# Patient Record
Sex: Female | Born: 1980 | Hispanic: Yes | Smoking: Never smoker
Health system: Southern US, Community
[De-identification: ages and names within clinical notes are randomized; demographics above are authoritative.]

---

## 2004-02-01 ENCOUNTER — Ambulatory Visit: Payer: Self-pay | Admitting: Family Medicine

## 2004-04-16 ENCOUNTER — Inpatient Hospital Stay: Payer: Self-pay

## 2004-05-12 ENCOUNTER — Emergency Department: Payer: Self-pay | Admitting: General Practice

## 2004-05-14 ENCOUNTER — Ambulatory Visit: Payer: Self-pay | Admitting: Unknown Physician Specialty

## 2005-07-16 ENCOUNTER — Emergency Department: Payer: Self-pay | Admitting: Emergency Medicine

## 2006-10-01 ENCOUNTER — Ambulatory Visit: Payer: Self-pay | Admitting: Family Medicine

## 2006-11-26 ENCOUNTER — Encounter: Payer: Self-pay | Admitting: Maternal and Fetal Medicine

## 2007-02-12 ENCOUNTER — Inpatient Hospital Stay: Payer: Self-pay | Admitting: Obstetrics and Gynecology

## 2012-12-27 ENCOUNTER — Ambulatory Visit: Payer: Self-pay | Admitting: Primary Care

## 2013-08-07 ENCOUNTER — Ambulatory Visit: Payer: Self-pay | Admitting: Primary Care

## 2017-04-03 NOTE — L&D Delivery Note (Signed)
Delivery Note CTSP and immediately arrived with cx exam of 7-8 cms. Checked pt and small ant lip was palp. Prepared for birth. Legs in stirrups, Interpreter with pt. AROM performed at 2040 for clear fluid mod amt. Pt pushed with coaching and At 9:00 PM a viable female was delivered via Vaginal, Spontaneous (Presentation:ROA ;Vtx, Ant and post shoulder del and then body followed and to mom's abd. CCx2 and cut per dad after delayed cord clamping.  APGAR: 8, 9; weight  .   Placenta status:SDOP intact at    And FF and lochia mod. There was a huge gush of blood right at delivery of placenta of 300 ml's.There was one area of concern noted on the placenta ([facility]). Suspect area of poss abruption and will send to pathology  ,1st degree perineal repair with 1%xylocaine and 3-0 CH on CT for repair. .  Cord:  with the following complications: none. Needles x 2 and correct and sponge ct x 10 correct. Nsy RN with baby.  Anesthesia:  1% local for repair Episiotomy:  None  Lacerations: 1st degree perineal Suture Repair:3-0 CH on CT Est. Blood Loss (mL):  300 ml's  Mom to Post partum recovery. Orders placed. Baby to mom's abd for skin to skin  ___________________________ Myrtie Cruisearon W. Jones,RN, MSN, CNM, FNP Certified Nurse Midwife [facility] 09/26/2017, 9:41 PM

## 2017-04-06 LAB — OB RESULTS CONSOLE GC/CHLAMYDIA
CHLAMYDIA, DNA PROBE: NEGATIVE
GC PROBE AMP, GENITAL: NEGATIVE

## 2017-04-06 LAB — OB RESULTS CONSOLE ABO/RH: RH Type: POSITIVE

## 2017-04-06 LAB — OB RESULTS CONSOLE HIV ANTIBODY (ROUTINE TESTING): HIV: NONREACTIVE

## 2017-04-06 LAB — OB RESULTS CONSOLE ANTIBODY SCREEN: Antibody Screen: NEGATIVE

## 2017-04-06 LAB — OB RESULTS CONSOLE RPR: RPR: NONREACTIVE

## 2017-04-06 LAB — OB RESULTS CONSOLE RUBELLA ANTIBODY, IGM: RUBELLA: IMMUNE

## 2017-04-06 LAB — OB RESULTS CONSOLE HEPATITIS B SURFACE ANTIGEN: Hepatitis B Surface Ag: NEGATIVE

## 2017-04-06 LAB — OB RESULTS CONSOLE VARICELLA ZOSTER ANTIBODY, IGG: Varicella: IMMUNE

## 2017-05-08 ENCOUNTER — Other Ambulatory Visit: Payer: Self-pay | Admitting: Primary Care

## 2017-05-08 DIAGNOSIS — Z3482 Encounter for supervision of other normal pregnancy, second trimester: Secondary | ICD-10-CM

## 2017-05-22 ENCOUNTER — Ambulatory Visit
Admission: RE | Admit: 2017-05-22 | Discharge: 2017-05-22 | Disposition: A | Discharge status: Home / Self Care | Payer: Self-pay | Source: Ambulatory Visit | Attending: Primary Care | Admitting: Primary Care

## 2017-05-22 DIAGNOSIS — Z3482 Encounter for supervision of other normal pregnancy, second trimester: Secondary | ICD-10-CM | POA: Insufficient documentation

## 2017-08-22 LAB — OB RESULTS CONSOLE GBS: STREP GROUP B AG: NEGATIVE

## 2017-09-26 ENCOUNTER — Other Ambulatory Visit: Payer: Self-pay

## 2017-09-26 ENCOUNTER — Inpatient Hospital Stay
Admission: EM | Admit: 2017-09-26 | Discharge: 2017-09-28 | DRG: 807 | Disposition: A | Discharge status: Home / Self Care | Payer: Medicaid Other | Attending: Obstetrics and Gynecology | Admitting: Obstetrics and Gynecology

## 2017-09-26 DIAGNOSIS — D649 Anemia, unspecified: Secondary | ICD-10-CM | POA: Diagnosis present

## 2017-09-26 DIAGNOSIS — O9902 Anemia complicating childbirth: Principal | ICD-10-CM | POA: Diagnosis present

## 2017-09-26 DIAGNOSIS — Z3A38 38 weeks gestation of pregnancy: Secondary | ICD-10-CM

## 2017-09-26 DIAGNOSIS — Z3483 Encounter for supervision of other normal pregnancy, third trimester: Secondary | ICD-10-CM | POA: Diagnosis present

## 2017-09-26 LAB — TYPE AND SCREEN
ABO/RH(D): A POS
Antibody Screen: NEGATIVE

## 2017-09-26 LAB — CBC
HEMATOCRIT: 34.6 % — AB (ref 35.0–47.0)
HEMOGLOBIN: 11.5 g/dL — AB (ref 12.0–16.0)
MCH: 27.5 pg (ref 26.0–34.0)
MCHC: 33.1 g/dL (ref 32.0–36.0)
MCV: 83.1 fL (ref 80.0–100.0)
Platelets: 222 10*3/uL (ref 150–440)
RBC: 4.16 MIL/uL (ref 3.80–5.20)
RDW: 19.3 % — ABNORMAL HIGH (ref 11.5–14.5)
WBC: 14.5 10*3/uL — ABNORMAL HIGH (ref 3.6–11.0)

## 2017-09-26 MED ORDER — LIDOCAINE HCL (PF) 1 % IJ SOLN: 30.0000 mL | INTRAMUSCULAR | Status: DC | PRN

## 2017-09-26 MED ORDER — LACTATED RINGERS IV SOLN: INTRAVENOUS | Status: DC

## 2017-09-26 MED ORDER — MISOPROSTOL 200 MCG PO TABS
ORAL_TABLET | ORAL | Status: AC
  Filled 2017-09-26: qty 4

## 2017-09-26 MED ORDER — OXYTOCIN 40 UNITS IN LACTATED RINGERS INFUSION - SIMPLE MED
2.5000 [IU]/h | INTRAVENOUS | Status: DC
  Administered 2017-09-26: 999 mL/h via INTRAVENOUS

## 2017-09-26 MED ORDER — SOD CITRATE-CITRIC ACID 500-334 MG/5ML PO SOLN: 30.0000 mL | ORAL | Status: DC | PRN

## 2017-09-26 MED ORDER — ACETAMINOPHEN 325 MG PO TABS: 650.0000 mg | ORAL_TABLET | ORAL | Status: DC | PRN

## 2017-09-26 MED ORDER — LACTATED RINGERS IV SOLN
INTRAVENOUS | Status: DC
  Administered 2017-09-26: 21:00:00 via INTRAVENOUS

## 2017-09-26 MED ORDER — OXYTOCIN 10 UNIT/ML IJ SOLN
INTRAMUSCULAR | Status: AC
  Filled 2017-09-26: qty 2

## 2017-09-26 MED ORDER — OXYTOCIN BOLUS FROM INFUSION: 500.0000 mL | Freq: Once | INTRAVENOUS | Status: DC

## 2017-09-26 MED ORDER — BUTORPHANOL TARTRATE 1 MG/ML IJ SOLN: 1.0000 mg | INTRAMUSCULAR | Status: DC | PRN

## 2017-09-26 MED ORDER — LACTATED RINGERS IV SOLN: 500.0000 mL | INTRAVENOUS | Status: DC | PRN

## 2017-09-26 MED ORDER — ONDANSETRON HCL 4 MG/2ML IJ SOLN: 4.0000 mg | Freq: Four times a day (QID) | INTRAMUSCULAR | Status: DC | PRN

## 2017-09-26 MED ORDER — OXYTOCIN 40 UNITS IN LACTATED RINGERS INFUSION - SIMPLE MED
INTRAVENOUS | Status: AC
  Administered 2017-09-26: 999 mL/h via INTRAVENOUS
  Filled 2017-09-26: qty 1000

## 2017-09-26 MED ORDER — IBUPROFEN 600 MG PO TABS
600.0000 mg | ORAL_TABLET | Freq: Four times a day (QID) | ORAL | Status: DC | PRN
  Administered 2017-09-26 – 2017-09-27 (×3): 600 mg via ORAL
  Filled 2017-09-26 (×4): qty 1

## 2017-09-26 MED ORDER — LIDOCAINE HCL (PF) 1 % IJ SOLN
INTRAMUSCULAR | Status: AC
  Filled 2017-09-26: qty 30

## 2017-09-26 MED ORDER — AMMONIA AROMATIC IN INHA
RESPIRATORY_TRACT | Status: AC
  Filled 2017-09-26: qty 10

## 2017-09-26 NOTE — Discharge Summary (Signed)
Obstetric Discharge Summary   Patient ID: Patient Name: Olivia Daniels DOB: 21-Aug-1980 MRN: [id number]  Date of Admission: 09/26/2017 Date of Delivery: 09/26/17 Delivered by: Harden MoJones, CNM Date of Discharge: 09/28/2017  [facility]:  [facility]  LMP:No LMP recorded. EDC Estimated Date of Delivery: 10/10/17 Gestational Age at Delivery: 6155w0d   Antepartum complications: anemia Admitting Diagnosis: Term pregnancy in active labor   Secondary Diagnoses: Patient Active Problem List   Diagnosis Date Noted  . Indication for care in labor and delivery, antepartum 09/26/2017    Augmentation: AROM  Complications:Suspect Abruption Intrapartum complications/course:  Delivery Type: NSVD of viable female infant "Yael" Anesthesia: lidocaine for repair Placenta: sponatneous Laceration: 1st degree perineal Episiotomy: none  Newborn Data: Live born female  Birth Weight:   APGAR: 8, 9  Newborn Delivery   Birth date/time:  09/26/2017 21:00:00 Delivery type:  Vaginal, Spontaneous    Postpartum Course  Patient had an uncomplicated postpartum course. By time of discharge on PPD#2, her pain was controlled on oral pain medications; she had appropriate lochia and was ambulating, voiding without difficulty and tolerating regular diet.  She was deemed stable for discharge to home.      Physical exam:  BP 116/78 (BP Location: Right Arm)   Pulse 94   Temp 98.4 F (36.9 C) (Oral)   Resp 18   Ht 5\' 3"  (1.6 m)   Wt 160 lb (72.6 kg)   SpO2 100%   Breastfeeding? Unknown   BMI 28.34 kg/m  General: alert and no distress Pulm: normal respiratory effort Lochia: appropriate Abdomen: soft, NT Uterine Fundus: firm, below umbilicus Extremities: No evidence of DVT seen on physical exam. No lower extremity edema. Perineum:healing and sutures intact.   Disposition: stable, discharge to home Baby Feeding: breastmilk Baby Disposition: home with mom  Contraception: Depo-Provera   Prenatal Labs:   Blood type/Rh A+  Antibody screen neg  Rubella Immune  Varicella Immune  RPR NR  HBsAg Neg  HIV NR  GC neg  Chlamydia neg  1 hour GTT 137  3 hour GTT 79, 145, 144, 138  GBS negative    Rh Immune globulin given: n/a Rubella vaccine given: n/a Tdap vaccine given in AP or PP setting: AP 08/07/2017 Flu vaccine given in AP or PP setting: declined AP   Plan:  Olivia Daniels was discharged to home in good condition. [facility] with delivery provider in 6 weeks.  Discharge Instructions: Per After Visit Summary. Activity: Advance as tolerated. Pelvic rest for 6 weeks.   Diet: Regular Discharge Medications:PNV, Fe, Ibuprofen  Outpatient follow up:  Follow-up Information    Sharee PimpleJones, Caron W, CNM Follow up in 6 week(s).   Specialty:  Obstetrics and Gynecology Contact information: [address] [facility] [place] [address] [phone]         6 weeks with CJones, CN[facility] _________________________________   Signed: Dala DockMcVey, Karmon Andis A, CNM 09/28/2017  9:49 AM

## 2017-09-26 NOTE — H&P (Addendum)
OB History & Physical   History of Present Illness:  Chief Complaint[facility] S/S starting at 1700 today/.  HPI:  Olivia Daniels is a 37 y.o. G3P2002 female at 1523w0d dated by LMP of 01/03/17 with EDD of 10/10/17. US at 21 2/7 weeks with EDD of 09/30/17..  She presents to L&D for active labor with advanced cx dilation of 7-8 cms. Pt began laboring around 5pm.   +FM, no CTX, no LOF, no VB  Pregnancy Issues: 1. GERD 2. Pelvic pain (chronic) 3. Allergic Dermatitis 4. AMA  Maternal Medical History:  GERD, Pelvic pain, Allergic dermatitis  PSH: Appendectomy, Cholecystectomy OB/GYN: 1st preg: 40 weeks, NSVD of viable female, 7#0, no complications 2nd preg: 41 weeks, NSVD of viable female, 6#, no complications No Known Allergies  Prior to Admission medications   Medication Sig Start Date End Date Taking? Authorizing Provider  ferrous sulfate (FER-IN-SOL) 75 (15 Fe) MG/ML SOLN Take by mouth.   Yes [provider]  Prenatal Vit-Fe Fumarate-FA (PRENATAL MULTIVITAMIN) TABS tablet Take 1 tablet by mouth daily at 12 noon.   Yes [provider]    [facility]   Social History: She  reports that she has never smoked. She has never used smokeless tobacco. She reports that she drank alcohol. She reports that she has current or past drug history.  Family History: Declines any pertinent FMH  Review of Systems: A full review of systems was performed and negative except as noted in the HPI.     Physical Exam:  Vital Signs: BP 136/86   Pulse 97   Temp 98.4 F (36.9 C) (Oral)   Resp (!) 22   Ht 5\' 3"  (1.6 m)   Wt 160 lb (72.6 kg)   Breastfeeding? Unknown   BMI 28.34 kg/m  General: no acute distress.  HEENT: normocephalic, atraumatic Heart: regular rate & rhythm.  No murmurs/rubs/gallops Lungs: clear to auscultation bilaterally, normal respiratory effort Abdomen: soft, gravid, non-tender;  EFW:8#5oz Pelvic:   External:  Normal external female genitalia  Cervix: Dilation: 10 / Effacement (%): 90 / Station: -1    Extremities: non-tender, symmetric, 1+ edema bilaterally.  DTRs: not checked due to rapid delivery  Neurologic: Alert & oriented x 3.    No results found for this or any previous visit (from the past 24 hour(s)).  Pertinent Results:  Prenatal Labs: Blood type/Rh A pos  Antibody screen neg  Rubella Immune  Varicella Immune  RPR NR  HBsAg Neg  HIV NR  GC neg  Chlamydia neg  Genetic screening negative  1 hour GTT Not found  3 hour GTT   GBS Neg    FHT:150 TOCO:q 2 mins SVE:  Dilation: 10 / Effacement (%): 90 / Station: -1    Cephalic by leopolds  Labs: A pos, GBS neg, HIV:NR, Varicella immune,Rubella immune, GC/CH neg   Assessment:  Olivia Daniels is a 37 y.o. 711P1001 female at 7123w0d with active labor process. AROM performed immediately prior to delivery and was clear.   Plan:  1. Admit to Labor & Delivery 2. CBC, T&S, Clrs, IVF 3. GBS neg  4. Consents obtained. 5. Continuous efm/toco 6. Ant SVD. 7. Pt did not receive a Tdap during AP:Will given PP  ----- Myrtie Cruisearon W. Jones,RN, MSN, CNM, FNP Certified Nurse Midwife [facility]

## 2017-09-27 LAB — CBC
HCT: 30.3 % — ABNORMAL LOW (ref 35.0–47.0)
Hemoglobin: 10.1 g/dL — ABNORMAL LOW (ref 12.0–16.0)
MCH: 27.7 pg (ref 26.0–34.0)
MCHC: 33.2 g/dL (ref 32.0–36.0)
MCV: 83.4 fL (ref 80.0–100.0)
PLATELETS: 185 10*3/uL (ref 150–440)
RBC: 3.64 MIL/uL — AB (ref 3.80–5.20)
RDW: 20.2 % — ABNORMAL HIGH (ref 11.5–14.5)
WBC: 10.6 10*3/uL (ref 3.6–11.0)

## 2017-09-27 MED ORDER — FERROUS SULFATE 325 (65 FE) MG PO TABS
325.0000 mg | ORAL_TABLET | Freq: Every day | ORAL | Status: DC
  Administered 2017-09-27 – 2017-09-28 (×2): 325 mg via ORAL
  Filled 2017-09-27 (×2): qty 1

## 2017-09-27 NOTE — Progress Notes (Signed)
Post Partum Day 1  Subjective: Doing well, no concerns. Ambulating without difficulty, pain managed with PO meds, tolerating regular diet, and voiding without difficulty.   No fever/chills, chest pain, shortness of breath, nausea/vomiting, or leg pain. No nipple or breast pain.   Objective: BP 111/70 (BP Location: Left Arm)   Pulse 88   Temp 98.5 F (36.9 C) (Oral)   Resp 20   Ht 5\' 3"  (1.6 m)   Wt 72.6 kg (160 lb)   SpO2 98%   Breastfeeding? Unknown   BMI 28.34 kg/m    Physical Exam:  General: alert, cooperative, appears stated age and no distress Breasts: soft/nontender CV: RRR Pulm: nl effort, CTABL Abdomen: soft, non-tender, active bowel sounds Uterine Fundus: firm Lochia: appropriate DVT Evaluation: No evidence of DVT seen on physical exam. No cords or calf tenderness. No significant calf/ankle edema.  Recent Labs    09/26/17 2236  HGB 11.5*  HCT 34.6*  WBC 14.5*  PLT 222    Assessment/Plan: 37 y.o. G3P3003 postpartum day # 1  -Continue routine PP care -Lactation consult PRN for breastfeeding.  -CBC ordered to assess for anemia.  -Updated prenatal records requested by RN to assess for immunization needs.  -Plan for discharge home in the morning.   Disposition: Continue inpatient postpartum care.    LOS: 1 day   Genia DelMargaret Cheyenne Bordeaux, CNM 09/27/2017, 8:44 AM   ----- Genia DelMargaret Dinna Severs Certified Nurse Midwife [facility]

## 2017-09-27 NOTE — Progress Notes (Signed)
Patient offered interpreter and declined. RN reminded her to let us know if she needs one.  Patient speaks a lot of english.

## 2017-09-27 NOTE — Lactation Note (Signed)
This note was copied from a baby's chart. Lactation Consultation Note  Patient Name: Olivia Daniels Today's Date: 09/27/2017 Reason for consult: Initial assessment   Maternal Data Formula Feeding for Exclusion: No Has patient been taught Hand Expression?: Yes Does the patient have breastfeeding experience prior to this delivery?: Yes  Feeding Feeding Type: Breast Fed Length of feed: 10 min  LATCH Score Latch: Repeated attempts needed to sustain latch, nipple held in mouth throughout feeding, stimulation needed to elicit sucking reflex.  Audible Swallowing: None  Type of Nipple: Everted at rest and after stimulation  Comfort (Breast/Nipple): Filling, red/small blisters or bruises, mild/mod discomfort  Hold (Positioning): Assistance needed to correctly position infant at breast and maintain latch.  LATCH Score: 5  Interventions Interventions: Breast feeding basics reviewed;Assisted with latch;Hand express;Breast compression;Support pillows  Lactation Tools Discussed/Used     Consult Status Mother states that she has been breastfeeding infant often and he seems to latch well but her nipples are beginning to get sore. Mother has two older children that she has breast fed for 1 year. Infant is having sufficient output. LC assisted mother with latch and educated on positioning and flanging infant's lips to get a good latch. LC gave mother comfort gels for sore nipples and stated to call for assistance if needed. Consult Status: PRN    Olivia Daniels 09/27/2017, 11:56 AM

## 2017-09-28 LAB — HIV ANTIBODY (ROUTINE TESTING W REFLEX): HIV SCREEN 4TH GENERATION: NONREACTIVE

## 2017-09-28 MED ORDER — MEDROXYPROGESTERONE ACETATE 150 MG/ML IM SUSP: 150.0000 mg | Freq: Once | INTRAMUSCULAR | 0 refills | Status: DC

## 2017-09-28 MED ORDER — IBUPROFEN 600 MG PO TABS: 600.0000 mg | ORAL_TABLET | Freq: Four times a day (QID) | ORAL | 0 refills | Status: AC | PRN

## 2017-09-28 MED ORDER — MEDROXYPROGESTERONE ACETATE 150 MG/ML IM SUSP
150.0000 mg | Freq: Once | INTRAMUSCULAR | Status: DC
  Filled 2017-09-28: qty 1

## 2017-09-28 MED ORDER — ACETAMINOPHEN 325 MG PO TABS: 650.0000 mg | ORAL_TABLET | ORAL | Status: AC | PRN

## 2017-09-28 NOTE — Progress Notes (Signed)
Post Partum Day 2 Subjective: Doing well, no complaints.  Tolerating regular diet, pain with PO meds, voiding and ambulating without difficulty.  No CP SOB Fever,Chills, N/V or leg pain; denies nipple or breast pain; no HA change of vision, RUQ/epigastric pain  Objective: BP 116/78 (BP Location: Right Arm)   Pulse 94   Temp 98.4 F (36.9 C) (Oral)   Resp 18   Ht 5\' 3"  (1.6 m)   Wt 160 lb (72.6 kg)   SpO2 100%   Breastfeeding? Unknown   BMI 28.34 kg/m    Physical Exam:  General: NAD Breasts: soft/nontender CV: RRR Pulm: nl effort, CTABL Abdomen: soft, NT, BS x 4 Perineum: minimal edema, repair well approximated Lochia: small Uterine Fundus: fundus firm and 2 fb below umbilicus DVT Evaluation: no cords, ttp LEs   Recent Labs    09/26/17 2236 09/27/17 0952  HGB 11.5* 10.1*  HCT 34.6* 30.3*  WBC 14.5* 10.6  PLT 222 185    Assessment/Plan: 37 y.o. G3P3003 postpartum day # 2  - Continue routine PP care - Lactation consult prn  - Discussed contraceptive options: Pt desires Depo prior to DC.  - Immunization status: all Imms up to date    Disposition: Does desire Dc home today.     Jmya Uliano A, CNM 09/28/2017  8:57 AM

## 2017-09-28 NOTE — Progress Notes (Signed)
Discharge order received from doctor. Depo offered prior to discharge. Patient declined at this time stating "she would [facility] Reviewed discharge instructions and prescriptions with patient and answered all questions using an interpreter. Follow up appointment instructions given. Patient verbalized understanding. ID bands checked. Patient discharged home with infant via wheelchair by nursing/auxillary.     Oswald HillockAbigail Garner, RN

## 2017-09-28 NOTE — Discharge Instructions (Signed)
Please call your doctor or return to the ER if you experience any chest pains, shortness of breath, dizziness, visual changes, fever greater than 101, any heavy bleeding (saturating more than 1 pad per hour), large clots, or foul smelling discharge, any worsening abdominal pain and cramping that is not controlled by pain medication, or any signs of postpartum depression. No tampons, enemas, douches, or sexual intercourse for 6 weeks. Also avoid tub baths, hot tubs, or swimming for 6 weeks.  °

## 2017-09-28 NOTE — Lactation Note (Signed)
This note was copied from a baby's chart. Lactation Consultation Note  Patient Name: Olivia Daniels ZOXWR'UToday's Date: 09/28/2017 Reason for consult: Follow-up assessment   Maternal Data    Feeding Feeding Type: Bottle Fed - Formula Nipple Type: Slow - flow Length of feed: 20 min  LATCH Score                   Interventions    Lactation Tools Discussed/Used     Consult Status LC checked in with mother to follow up on any breastfeeding concerns. Mother states that infant has been breastfeeding well but at the last feeding infant was still showing signs of hunger after the feeding. Mother states that infant was given a little formula after breastfeeding. Mother states that she would like to supplement with formula until her colostrum changes to breast milk and she is producing more. Family denies any other lactation concerns.  Consult Status: Follow-up    Arlyss Gandylicia Tamika Nou 09/28/2017, 10:04 AM

## 2017-09-29 LAB — RPR: RPR Ser Ql: NONREACTIVE

## 2017-09-30 LAB — SURGICAL PATHOLOGY

## 2018-11-25 ENCOUNTER — Other Ambulatory Visit: Payer: Self-pay

## 2018-11-25 ENCOUNTER — Encounter: Payer: Self-pay | Admitting: Emergency Medicine

## 2018-11-25 ENCOUNTER — Telehealth: Payer: Self-pay | Admitting: Emergency Medicine

## 2018-11-25 ENCOUNTER — Emergency Department
Admission: EM | Admit: 2018-11-25 | Discharge: 2018-11-25 | Disposition: A | Discharge status: Home / Self Care | Payer: Self-pay | Attending: Emergency Medicine | Admitting: Emergency Medicine

## 2018-11-25 ENCOUNTER — Emergency Department: Payer: Self-pay

## 2018-11-25 DIAGNOSIS — Z79899 Other long term (current) drug therapy: Secondary | ICD-10-CM | POA: Insufficient documentation

## 2018-11-25 DIAGNOSIS — U071 COVID-19: Secondary | ICD-10-CM | POA: Insufficient documentation

## 2018-11-25 DIAGNOSIS — J189 Pneumonia, unspecified organism: Secondary | ICD-10-CM | POA: Insufficient documentation

## 2018-11-25 MED ORDER — AZITHROMYCIN 250 MG PO TABS: ORAL_TABLET | ORAL | 0 refills | Status: AC

## 2018-11-25 MED ORDER — GUAIFENESIN-CODEINE 100-10 MG/5ML PO SOLN
5.0000 mL | Freq: Four times a day (QID) | ORAL | 0 refills | Status: DC | PRN
Start: 2018-11-25 — End: 2021-09-28

## 2018-11-25 MED ORDER — AZITHROMYCIN 500 MG PO TABS
500.0000 mg | ORAL_TABLET | Freq: Once | ORAL | Status: AC
Start: 2018-11-25 — End: 2018-11-25
  Administered 2018-11-25: 500 mg via ORAL
  Filled 2018-11-25: qty 1

## 2018-11-25 MED ORDER — HYDROCOD POLST-CPM POLST ER 10-8 MG/5ML PO SUER
5.0000 mL | Freq: Once | ORAL | Status: AC
  Administered 2018-11-25: 5 mL via ORAL
  Filled 2018-11-25: qty 5

## 2018-11-25 MED ORDER — ACETAMINOPHEN 500 MG PO TABS
1000.0000 mg | ORAL_TABLET | Freq: Once | ORAL | Status: AC
  Administered 2018-11-25: 1000 mg via ORAL
  Filled 2018-11-25: qty 2

## 2018-11-25 MED ORDER — ONDANSETRON 4 MG PO TBDP: 4.0000 mg | ORAL_TABLET | Freq: Three times a day (TID) | ORAL | 0 refills | Status: DC | PRN

## 2018-11-25 NOTE — ED Notes (Signed)
Patient states she was recently diagnosed with COVID. States for the past 8 days she has had intermittent fever that is helped by OTC meds, however comes back as soon as meds wear off. Also complaining of dry cough. States she has been more fatigued than normal lately.   Patient is adamant to this RN that if her condition were to worsen, she does not want to be intubated. This RN verbalized understanding and told patient MD would be made aware. Encouraged patient to let MD know as well. Assured that her vitals are stable at this time.

## 2018-11-25 NOTE — ED Notes (Signed)
Instructions given with help of interpreter.

## 2018-11-25 NOTE — ED Triage Notes (Signed)
Has been tested positive for covid and now having trouble breathing.

## 2018-11-25 NOTE — Telephone Encounter (Signed)
Pt requesting RX that was sent to [facility] , to [facility] )  [facility] [phone]  Per Dr.Siadecki  RX 250mg  Zpack Zofran ODT 4mg   1 tab by mouth every 8 hours for N/V Qty 20

## 2018-11-25 NOTE — ED Provider Notes (Signed)
[facility] Emergency Department Provider Note  Time seen: 2:05 PM  I have reviewed the triage vital signs and the nursing notes.   HISTORY  Chief Complaint Shortness of Breath   HPI Olivia Daniels is a 38 y.o. female with no significant past medical history recently diagnosed with COVID, on day 8 of symptoms presents for continued fever cough and shortness of breath.  According to the patient for the past 8 days she has had a fever cough and shortness of breath, states she is not feeling any better and today was feeling somewhat worse so she called the nurse hotline who instructed her to come to the emergency department for evaluation.  Here overall the patient appears well, speaking in complete sentences, Spanish interpreter used for this evaluation.  Patient is satting between 98 and 100% on room air.  Is mildly tachycardic.   History reviewed. No pertinent past medical history.  Patient Active Problem List   Diagnosis Date Noted  . Indication for care in labor and delivery, antepartum 09/26/2017    History reviewed. No pertinent surgical history.  Prior to Admission medications   Medication Sig Start Date End Date Taking? Authorizing Provider  acetaminophen (TYLENOL) 325 MG tablet Take 2 tablets (650 mg total) by mouth every 4 (four) hours as needed (for pain scale < 4ORtemperature>/=100.5 F). 09/28/17   McVey, Prudencio Pairebecca A, CNM  ferrous sulfate (FER-IN-SOL) 75 (15 Fe) MG/ML SOLN Take by mouth.    [provider]  ibuprofen (ADVIL,MOTRIN) 600 MG tablet Take 1 tablet (600 mg total) by mouth every 6 (six) hours as needed for moderate pain. 09/28/17   McVey, Prudencio Pairebecca A, CNM  medroxyPROGESTERone (DEPO-PROVERA) 150 MG/ML injection Inject 1 mL (150 mg total) into the muscle once for 1 dose. 09/28/17 09/28/17  McVey, Prudencio Pairebecca A, CNM  Prenatal Vit-Fe Fumarate-FA (PRENATAL MULTIVITAMIN) TABS tablet Take 1 tablet by mouth daily at 12 noon.    [provider]    No Known Allergies  No family history on file.  Social History Social History   Tobacco Use  . Smoking status: Never Smoker  . Smokeless tobacco: Never Used  Substance Use Topics  . Alcohol use: Not Currently    Frequency: Never  . Drug use: Not Currently    Review of Systems Constitutional: Fever x8 days. Cardiovascular: Negative for chest pain. Respiratory: Positive for shortness of breath x8 days.  Positive for cough. Gastrointestinal: Negative for abdominal pain, vomiting Musculoskeletal: Negative for musculoskeletal complaints Skin: Negative for skin complaints  Neurological: Negative for headache All other ROS negative  ____________________________________________   PHYSICAL EXAM:  VITAL SIGNS: ED Triage Vitals  Enc Vitals Group     BP 11/25/18 1256 125/87     Pulse Rate 11/25/18 1256 (!) 121     Resp 11/25/18 1256 20     Temp --      Temp src --      SpO2 11/25/18 1256 100 %     Weight 11/25/18 1310 124 lb (56.2 kg)     Height 11/25/18 1310 4\' 4"  (1.321 m)     Head Circumference --      Peak Flow --      Pain Score 11/25/18 1241 0     Pain Loc --      Pain Edu? --      Excl. in GC? --    Constitutional: Alert and oriented. Well appearing and in no distress. Eyes: Normal exam ENT  Head: Normocephalic and atraumatic.      Mouth/Throat: Mucous membranes are moist. Cardiovascular: Regular rhythm rate around 120 bpm. Respiratory: Normal respiratory effort without tachypnea nor retractions. Breath sounds are clear, without wheeze rales or rhonchi.  Occasional cough during exam. Gastrointestinal: Soft and nontender. No distention.   Musculoskeletal: Nontender with normal range of motion in all extremities.  Neurologic:  Normal speech and language. No gross focal neurologic deficits Skin:  Skin is warm, dry and intact.  Psychiatric: Mood and affect are normal. Speech and behavior are normal.    ____________________________________________   RADIOLOGY  Multifocal bilateral pneumonia.  ____________________________________________   INITIAL IMPRESSION / ASSESSMENT AND PLAN / ED COURSE  Pertinent labs & imaging results that were available during my care of the patient were reviewed by me and considered in my medical decision making (see chart for details).   Patient presents for evaluation given continued fever cough and shortness of breath x8 days.  Patient did test positive for COVID.  Differential would include worsening COVID symptoms, hypoxia, pneumonia.  Reassuringly patient appears well on physical exam speaking in full sentences, does not appear to be in any distress you are having any difficulty breathing.  Patient is mildly tachycardic around 121.  We will dose Tylenol, oral fluids.  Given the patient's cough we will also dosed Tussionex for the patient.  We will obtain a chest x-ray and continue to closely monitor.  Patient's pulse ox has remained 98 to 100% throughout my evaluation while on room air.  X-ray consistent multifocal bilateral pneumonia.  Patient continues to appear well clinically with 100% room air saturation.  We will cover with Zithromax, sent home with cough medication and Zofran to be used if needed.  Discussed return precautions.  Patient agreeable.  Olivia Daniels was evaluated in Emergency Department on 11/25/2018 for the symptoms described in the history of present illness. She was evaluated in the context of the global COVID-19 pandemic, which necessitated consideration that the patient might be at risk for infection with the SARS-CoV-2 virus that causes COVID-19. Institutional protocols and algorithms that pertain to the evaluation of patients at risk for COVID-19 are in a state of rapid change based on information released by regulatory bodies including the CDC and federal and state organizations. These policies and algorithms were followed  during the patient's care in the ED.  ____________________________________________   FINAL CLINICAL IMPRESSION(S) / ED DIAGNOSES  COVID-19   Harvest Dark, MD 11/25/18 1440

## 2019-10-01 IMAGING — US US OB COMP +14 WK
1 series · 13 of 28 positions shown · non-contrast
Comparison: none

CLINICAL DATA: Gestational age by LMP of 19 weeks 6 days. Evaluate
dating and anatomy.

EXAM:
OBSTETRICAL ULTRASOUND >14 WKS

[Series 1: us ob comp +14 wk · 0.23mm/px · 13 of 61 slices shown]
[im 3/61]
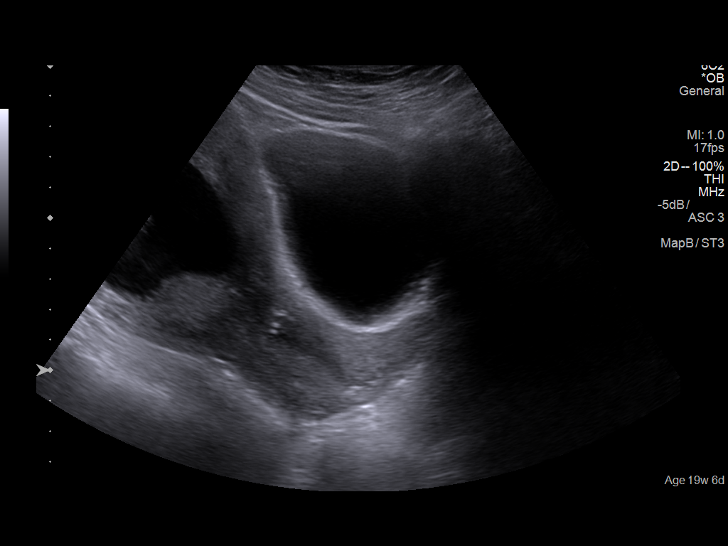
[im 7/61]
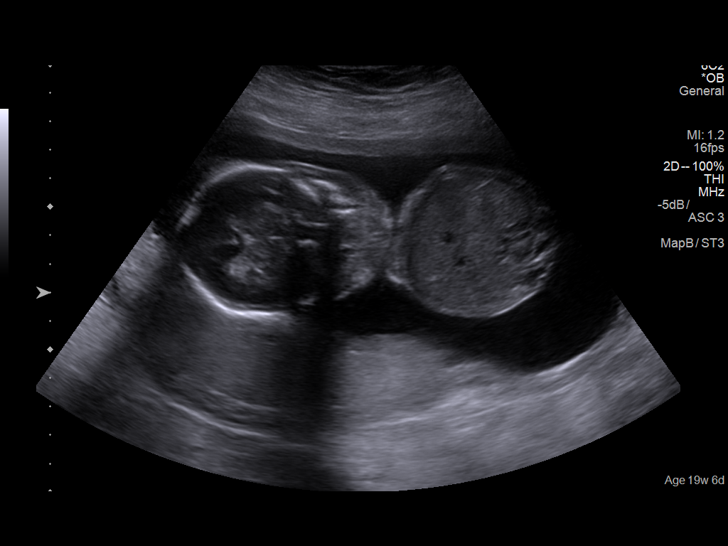
[im 12/61]
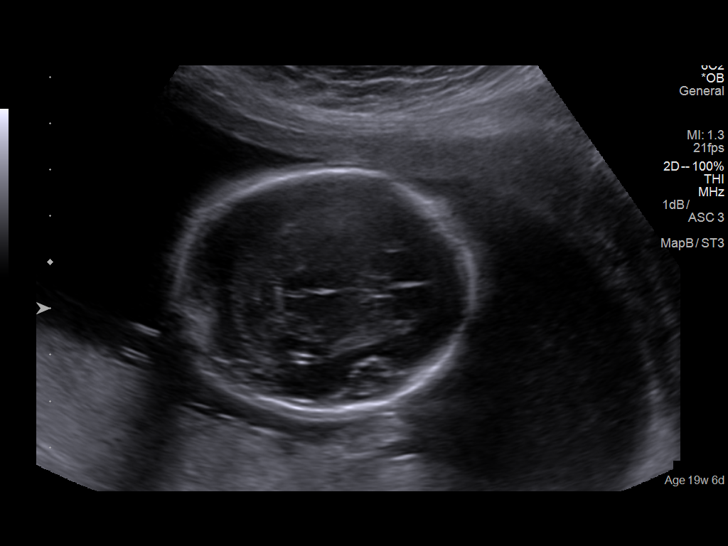
[im 16/61]
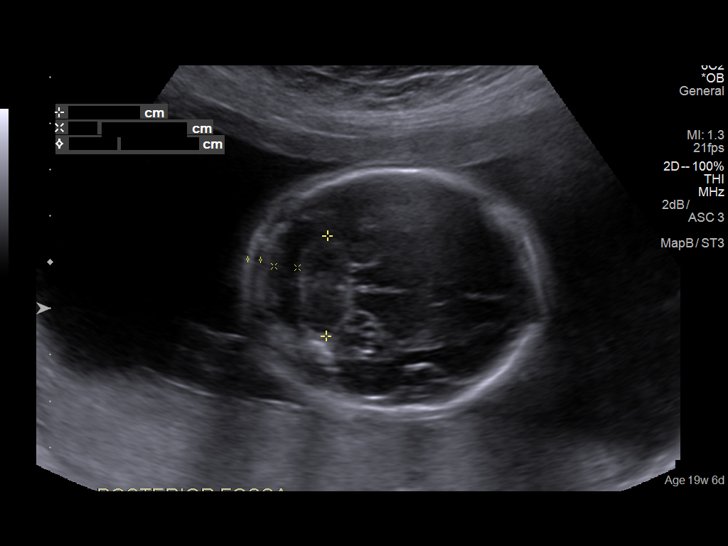
[im 21/61]
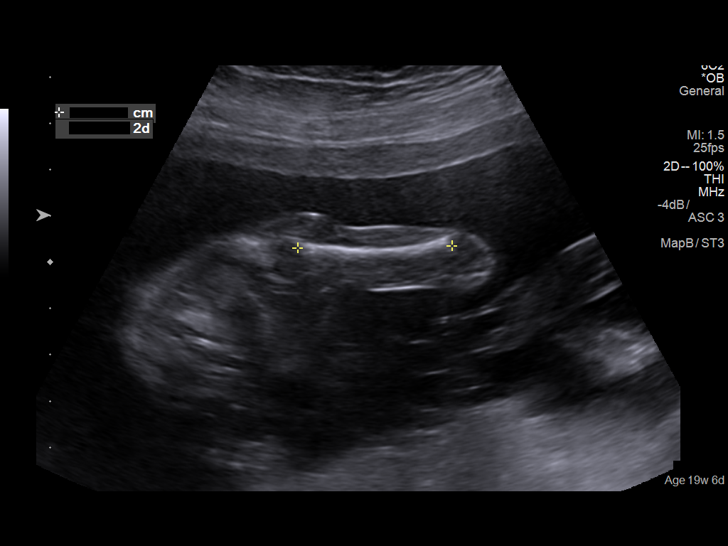
[im 25/61]
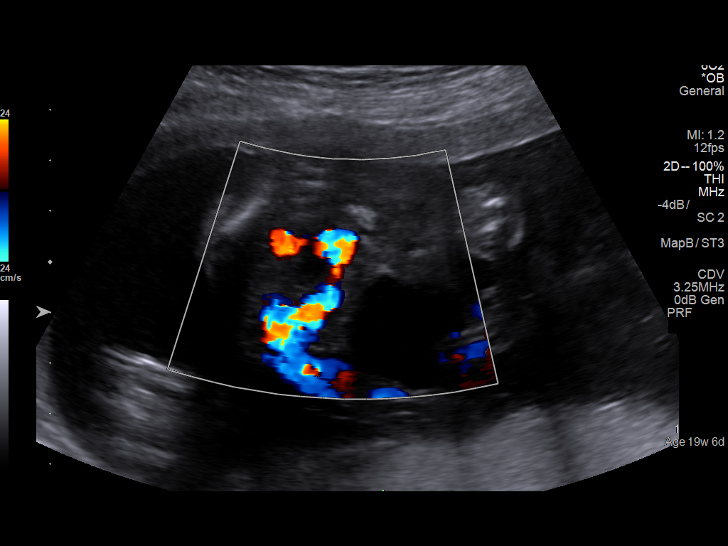
[im 32/61]
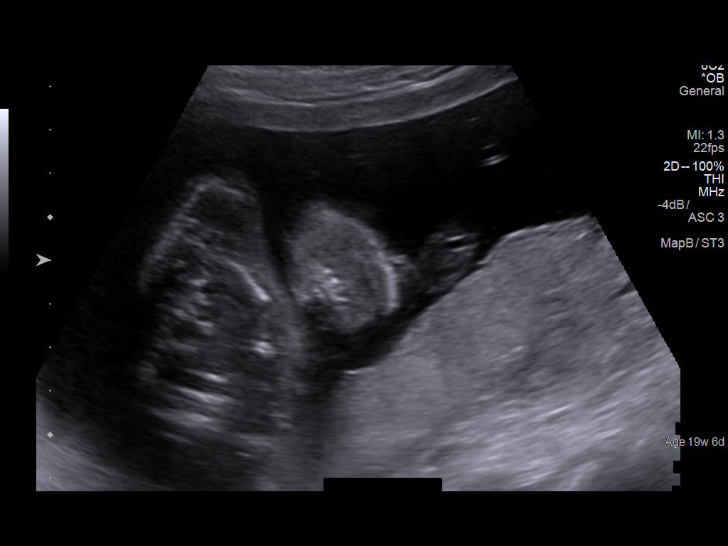
[im 36/61]
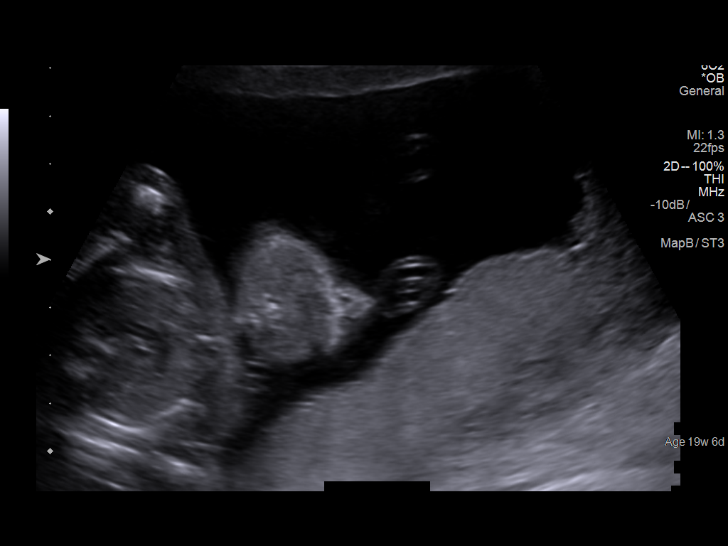
[im 41/61]
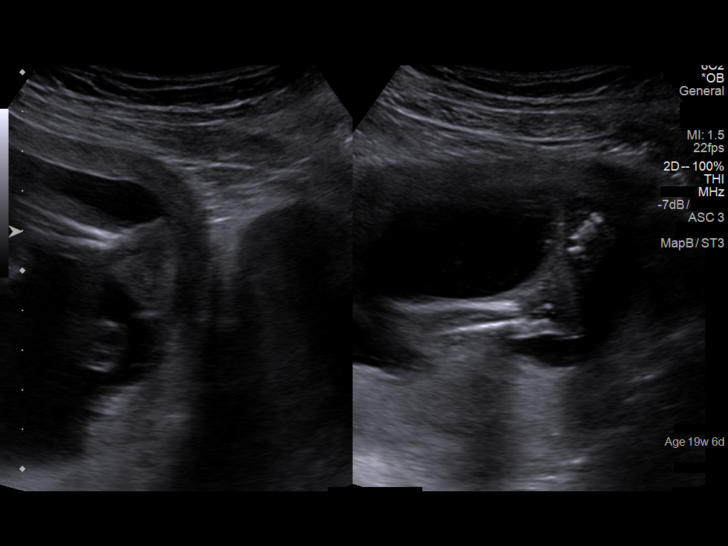
[im 45/61]
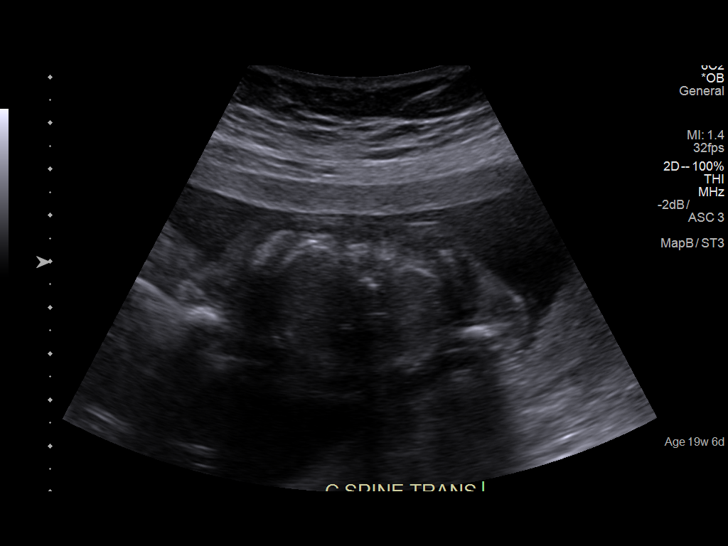
[im 49/61]
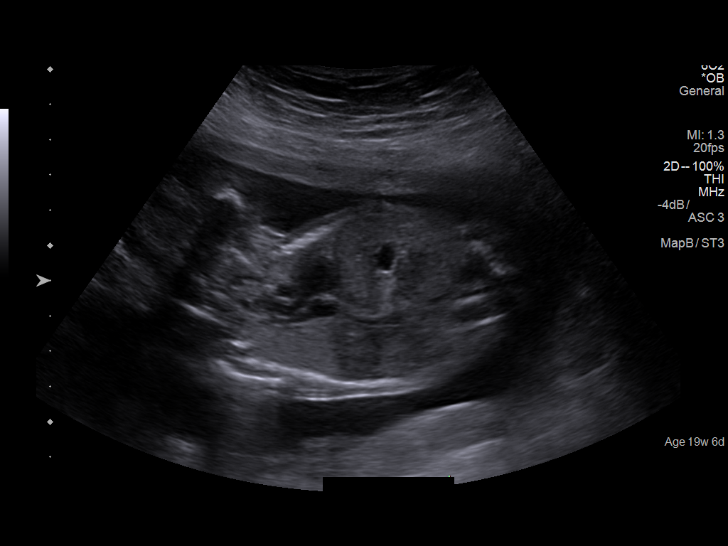
[im 54/61]
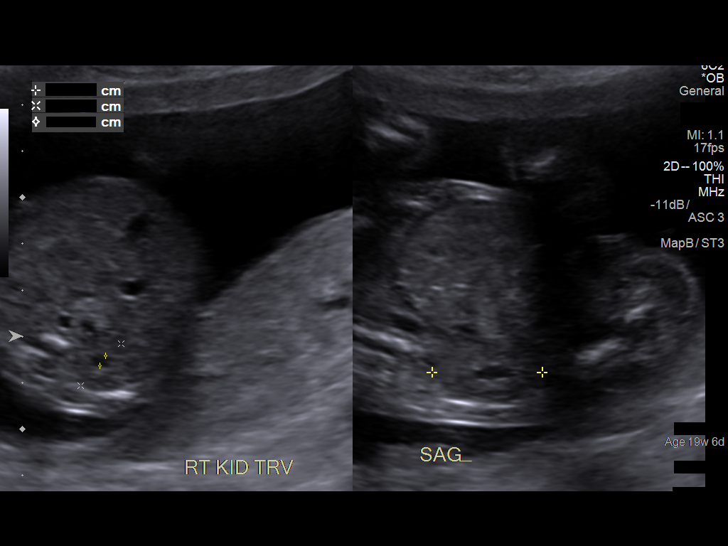
[im 58/61]
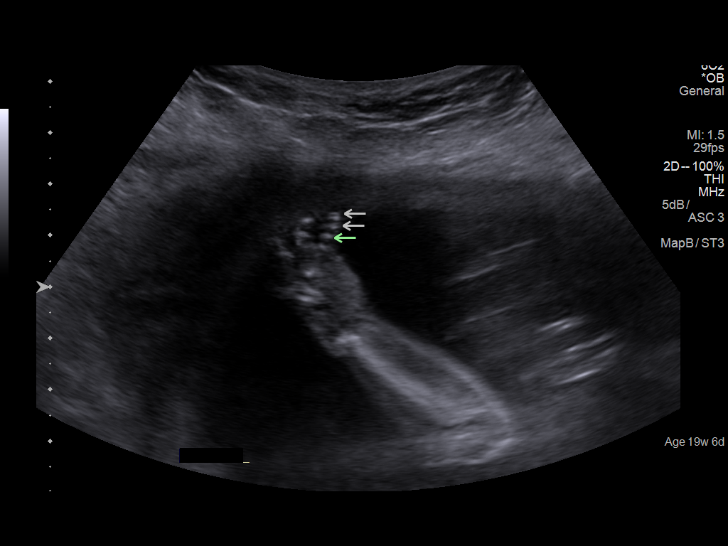

[13 of 28 positions shown; findings below may reference images not displayed]

FINDINGS: Number of Fetuses: 1

Heart Rate:  155 bpm

Movement: Yes

Presentation: Variable

Previa: No

Placental Location: Posterior

Amniotic Fluid (Subjective): Within normal limits

Amniotic Fluid (Objective):

Vertical pocket 6.3cm

FETAL BIOMETRY

BPD:  5.2cm 21w 5d

HC:    19.0cm 21w 2d

AC:   16.7cm 21w 5d

FL:   3.4cm 20w 4d

Current Mean GA: 21w 2d US EDC: 09/30/2017

FETAL ANATOMY

Lateral Ventricles: Appears normal

Thalami/CSP: Appears normal

Posterior Fossa:  Appears normal

Nuchal Region: Appears normal

Upper Lip: Appears normal

Spine: Appears normal

4 Chamber Heart on Left: Appears normal

LVOT: Appears normal

RVOT: Appears normal

Stomach on Left: Appears normal

3 Vessel Cord: Appears normal

Cord Insertion site: Appears normal

Kidneys: Appears normal

Bladder: Appears normal

Extremities: Appears normal

Sex: Male

Maternal Findings:

Cervix: Normal cervical length measuring 3.8 cm. No other
abnormality identified.
IMPRESSION: Single living intrauterine fetus measuring 21 weeks 2 days, with US
EDC of 09/30/2017. This is approximately 1.5 weeks ahead of
menstrual dates.

No fetal anomalies seen involving visualized anatomy noted above.

## 2020-03-04 IMAGING — DX PORTABLE CHEST - 1 VIEW
1 series · 1 of 1 positions shown · non-contrast
Comparison: None.

CLINICAL DATA: Intermittent fever for 8 days. [id number] positive
patient.

EXAM:
PORTABLE CHEST 1 VIEW

[chest ap]
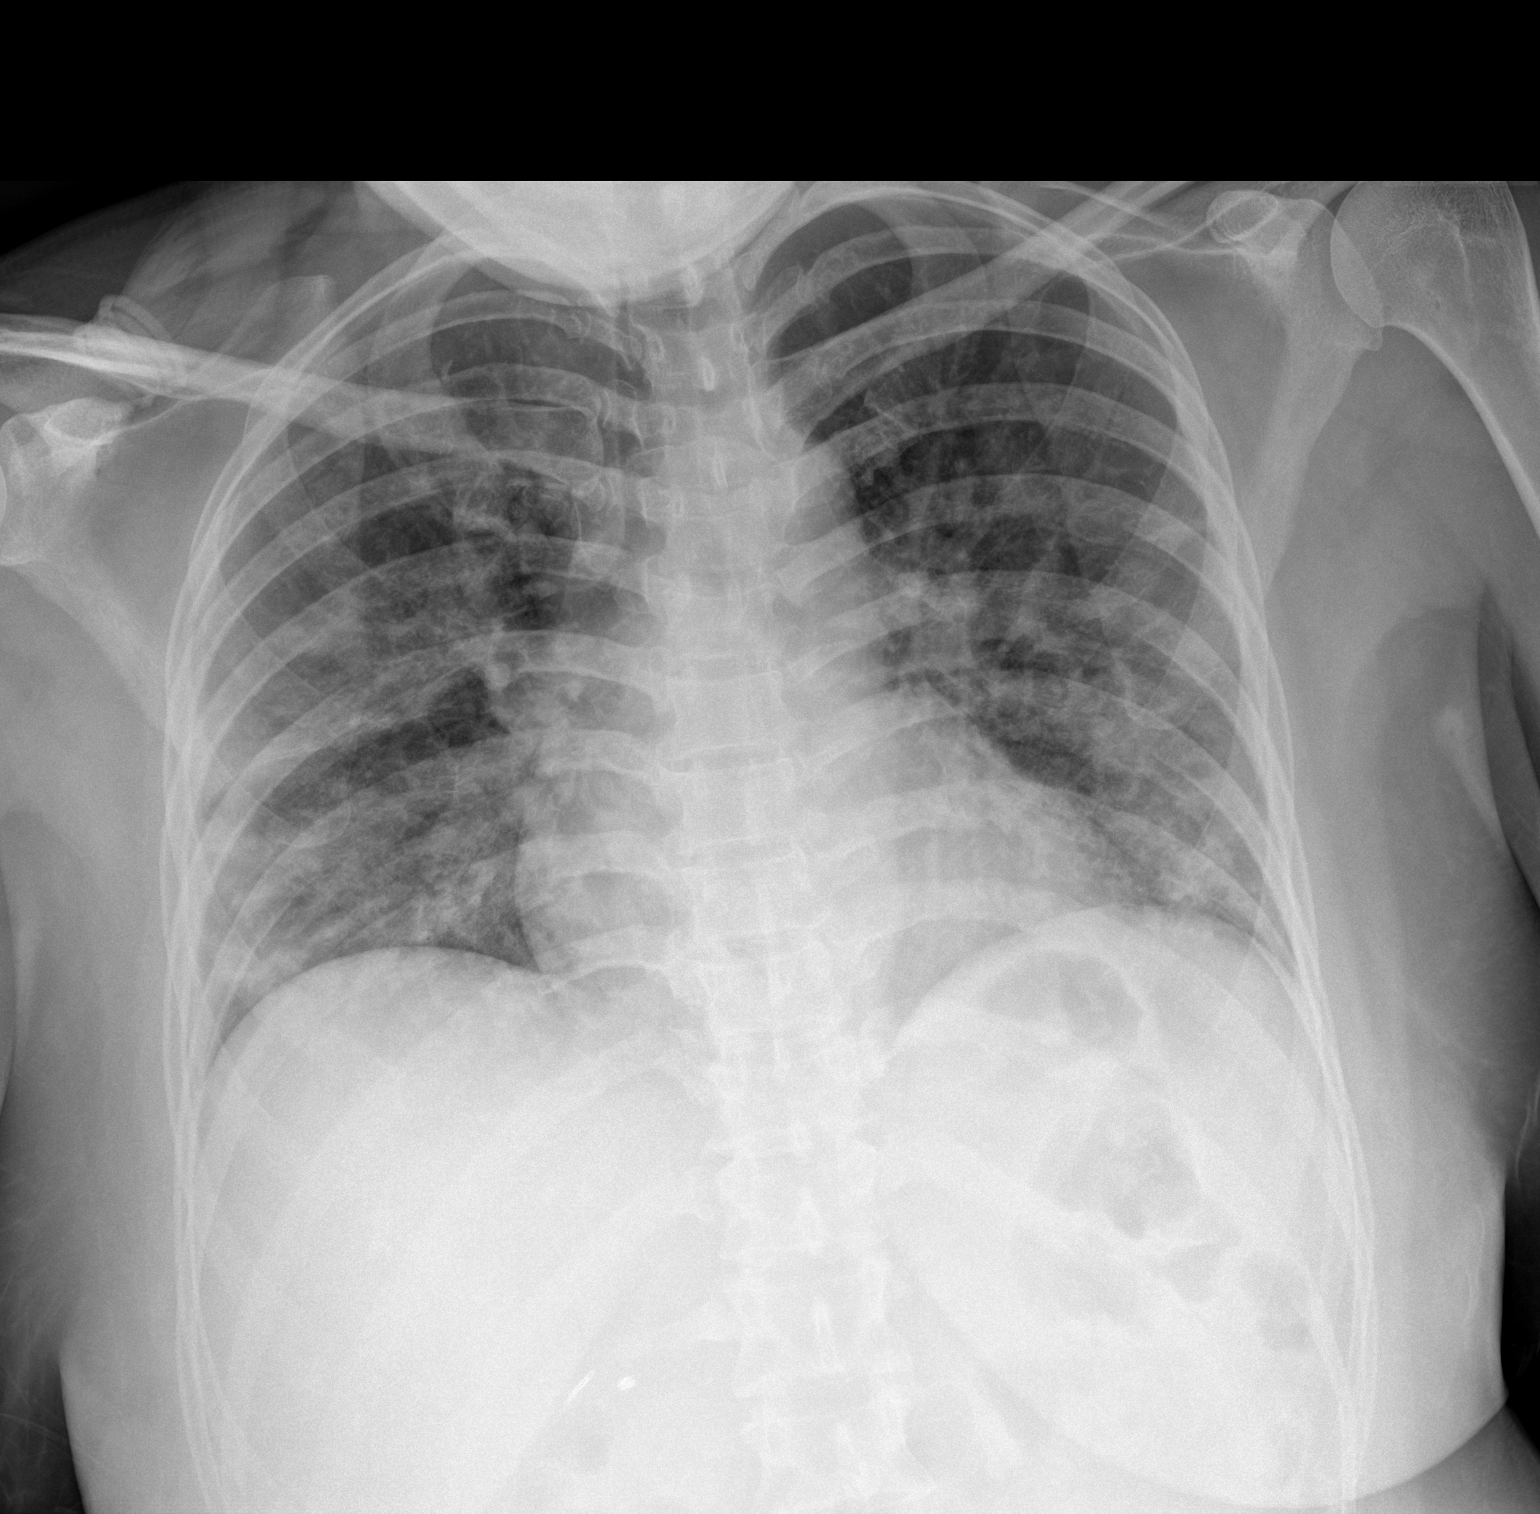

[1 of 1 positions shown; findings below may reference images not displayed]

FINDINGS: Patchy airspace disease is present bilaterally. No pneumothorax or
pleural effusion. Heart size is normal. No acute or focal bony
abnormality.
IMPRESSION: Multifocal bilateral airspace disease consistent with pneumonia.
Atypical/viral infection is within the differential.

## 2021-08-22 ENCOUNTER — Other Ambulatory Visit: Payer: Self-pay

## 2021-08-22 ENCOUNTER — Encounter: Payer: Self-pay | Admitting: Emergency Medicine

## 2021-08-22 ENCOUNTER — Emergency Department
Admission: EM | Admit: 2021-08-22 | Discharge: 2021-08-22 | Disposition: A | Discharge status: Home / Self Care | Payer: No Typology Code available for payment source | Attending: Emergency Medicine | Admitting: Emergency Medicine

## 2021-08-22 ENCOUNTER — Telehealth: Payer: Self-pay | Admitting: Emergency Medicine

## 2021-08-22 ENCOUNTER — Emergency Department: Payer: No Typology Code available for payment source

## 2021-08-22 DIAGNOSIS — R0602 Shortness of breath: Secondary | ICD-10-CM | POA: Diagnosis present

## 2021-08-22 DIAGNOSIS — Z8616 Personal history of COVID-19: Secondary | ICD-10-CM | POA: Diagnosis not present

## 2021-08-22 DIAGNOSIS — U071 COVID-19: Secondary | ICD-10-CM | POA: Insufficient documentation

## 2021-08-22 LAB — CBC
HCT: 40.6 % (ref 36.0–46.0)
Hemoglobin: 13.5 g/dL (ref 12.0–15.0)
MCH: 29.5 pg (ref 26.0–34.0)
MCHC: 33.3 g/dL (ref 30.0–36.0)
MCV: 88.8 fL (ref 80.0–100.0)
Platelets: 280 10*3/uL (ref 150–400)
RBC: 4.57 MIL/uL (ref 3.87–5.11)
RDW: 12.4 % (ref 11.5–15.5)
WBC: 7.9 10*3/uL (ref 4.0–10.5)
nRBC: 0 % (ref 0.0–0.2)

## 2021-08-22 LAB — BASIC METABOLIC PANEL
Anion gap: 10 (ref 5–15)
BUN: 10 mg/dL (ref 6–20)
CO2: 20 mmol/L — ABNORMAL LOW (ref 22–32)
Calcium: 9.3 mg/dL (ref 8.9–10.3)
Chloride: 110 mmol/L (ref 98–111)
Creatinine, Ser: 0.58 mg/dL (ref 0.44–1.00)
GFR, Estimated: >60 mL/min (ref >60)
Glucose, Bld: 134 mg/dL — ABNORMAL HIGH (ref 70–99)
Potassium: 3.4 mmol/L — ABNORMAL LOW (ref 3.5–5.1)
Sodium: 140 mmol/L (ref 135–145)

## 2021-08-22 LAB — RESP PANEL BY RT-PCR (FLU A&B, COVID) ARPGX2
Influenza A by PCR: NEGATIVE
Influenza B by PCR: NEGATIVE
SARS Coronavirus 2 by RT PCR: POSITIVE — AB

## 2021-08-22 MED ORDER — DOXYCYCLINE MONOHYDRATE 100 MG PO TABS: 100.0000 mg | ORAL_TABLET | Freq: Two times a day (BID) | ORAL | 0 refills | Status: AC

## 2021-08-22 MED ORDER — ALBUTEROL SULFATE HFA 108 (90 BASE) MCG/ACT IN AERS: 2.0000 | INHALATION_SPRAY | Freq: Four times a day (QID) | RESPIRATORY_TRACT | 2 refills | Status: AC | PRN

## 2021-08-22 MED ORDER — DOXYCYCLINE HYCLATE 100 MG PO TABS: 100.0000 mg | ORAL_TABLET | Freq: Two times a day (BID) | ORAL | 0 refills | Status: AC

## 2021-08-22 MED ORDER — ALBUTEROL SULFATE HFA 108 (90 BASE) MCG/ACT IN AERS: 2.0000 | INHALATION_SPRAY | Freq: Four times a day (QID) | RESPIRATORY_TRACT | 2 refills | Status: DC | PRN

## 2021-08-22 NOTE — ED Triage Notes (Signed)
Pt here with SOB. Pt tested positive for covid on Thurs at [employer]. Pt's breathing got worse Sat. Pt has hx of developing pneumonia with last covid infection. Pt denies N/V/D.

## 2021-08-22 NOTE — Telephone Encounter (Cosign Needed)
Needs prescriptions changed to [facility] in [place]

## 2021-08-22 NOTE — Discharge Instructions (Addendum)
-  Take all of your antibiotics as prescribed.  You may continue to take your Paxlovid as prescribed as well.  -Take Tylenol/ibuprofen as needed for fever/body aches  -You may use the albuterol inhaler as needed for shortness of breath.  -Follow-up with your primary care provider within 2 to 3 days to ensure improvement in symptoms.  -Return to the emergency department anytime if you begin to experience any new or worsening symptoms.

## 2021-08-22 NOTE — ED Provider Triage Note (Signed)
Emergency Medicine Provider Triage Evaluation Note  Olivia Daniels , a 41 y.o. female  was evaluated in triage.  Pt complains of shortness of breath, recently tested positive for COVID.  Review of Systems  Positive: Slight shortness of breath, recently tested positive for coronavirus Negative: Chest pain  Physical Exam  BP (!) 147/90 (BP Location: Left Arm)   Pulse (!) 112   Temp 98.3 F (36.8 C) (Oral)   Resp 18   Ht 4\' 4"  (1.321 m)   Wt 56.2 kg   SpO2 98%   BMI 32.22 kg/m  Gen:   Awake, no distress   Resp:  Normal effort  MSK:   Moves extremities without difficulty  Other:  Lungs clear bilaterally.  No acute distress noted.  Normal hemodynamics except for slight tachycardia.  Medical Decision Making  Medically screening exam initiated at 11:29 AM.  Appropriate orders placed.  Olivia Daniels was informed that the remainder of the evaluation will be completed by another provider, this initial triage assessment does not replace that evaluation, and the importance of remaining in the ED until their evaluation is complete.  Imaging including chest x-ray and labs CBC metabolic panel ordered.   Olivia Corin, MD 08/22/21 1130

## 2021-08-22 NOTE — ED Provider Notes (Addendum)
[facility] Provider Note    Event Date/Time   First MD Initiated Contact with Patient 08/22/21 1206     (approximate)   History   Chief Complaint Shortness of Breath   HPI Olivia Daniels is a 41 y.o. female, no remarkable medical history, presents the emergency department for evaluation of shortness of breath.  Patient states that she for started experiencing symptoms approximately 1 week prior.  Reports congestion, myalgias, and fatigue.  Tested positive for COVID on Thursday at [facility].  She was seen by her primary care provider 3 days ago, where they prescribed her Paxlovid.  Since then, she states that her breathing has gotten worse.  She has a history of developing pneumonia with her last COVID infection.  Denies chest pain, abdominal pain, flank pain, nausea/vomiting, diarrhea, dysuria, vision change, hearing change, rash/lesions, or dizziness/lightheadedness.  History Limitations: Spanish-speaking        Physical Exam  Triage Vital Signs: ED Triage Vitals  Enc Vitals Group     BP 08/22/21 1119 (!) 147/90     Pulse Rate 08/22/21 1119 (!) 112     Resp 08/22/21 1119 18     Temp 08/22/21 1119 98.3 F (36.8 C)     Temp Source 08/22/21 1119 Oral     SpO2 08/22/21 1119 98 %     Weight 08/22/21 1122 123 lb 14.4 oz (56.2 kg)     Height 08/22/21 1122 4\' 4"  (1.321 m)     Head Circumference --      Peak Flow --      Pain Score 08/22/21 1120 0     Pain Loc --      Pain Edu? --      Excl. in GC? --     Most recent vital signs: Vitals:   08/22/21 1119  BP: (!) 147/90  Pulse: (!) 112  Resp: 18  Temp: 98.3 F (36.8 C)  SpO2: 98%    General: Awake, NAD.  Skin: Warm, dry. No rashes or lesions.  Eyes: PERRL. Conjunctivae normal.  CV: Good peripheral perfusion.  Resp: Normal effort.  Mild rhonchi and wheezing bilaterally Abd: Soft, non-tender. No distention.  Neuro: At baseline. No gross neurological deficits.   Focused Exam:  Throat exam unremarkable.  Physical Exam    ED Results / Procedures / Treatments  Labs (all labs ordered are listed, but only abnormal results are displayed) Labs Reviewed  RESP PANEL BY RT-PCR (FLU A&B, COVID) ARPGX2 - Abnormal; Notable for the following components:      Result Value   SARS Coronavirus 2 by RT PCR POSITIVE (*)    All other components within normal limits  BASIC METABOLIC PANEL - Abnormal; Notable for the following components:   Potassium 3.4 (*)    CO2 20 (*)    Glucose, Bld 134 (*)    All other components within normal limits  CBC     EKG Sinus tachycardia, rate 105, no T-segment changes, no axis deviations, no AV blocks, normal QRS interval.   RADIOLOGY  ED Provider Interpretation: I personally viewed and interpreted this chest x-ray, evidence suggestive of possible atypical infection.  DG Chest 2 View  Result Date: 08/22/2021 CLINICAL DATA:  Shortness of breath EXAM: CHEST - 2 VIEW COMPARISON:  November 25, 2018 FINDINGS: The heart size and mediastinal contours are within normal limits. Faint bilateral interstitial opacities. No visible pleural effusion or pneumothorax. No acute osseous abnormality. Cholecystectomy clips. IMPRESSION: Faint bilateral interstitial opacities, may  reflect atypical infection. Electronically Signed   By: Maudry Mayhew M.D.   On: 08/22/2021 12:08    PROCEDURES:  Critical Care performed: None.  Procedures    MEDICATIONS ORDERED IN ED: Medications - No data to display   IMPRESSION / MDM / ASSESSMENT AND PLAN / ED COURSE  I reviewed the triage vital signs and the nursing notes.                              Differential diagnosis includes, but is not limited to, COVID-19, influenza, bronchitis, pneumonia.   ED Course Patient appears well, NAD.  Respiratory panel positive for COVID-19  CBC shows no leukocytosis or anemia.  BMP does not show any remarkable kidney injury or significant electrolyte  abnormalities.  Assessment/Plan Presentation consistent with COVID-19 infection.  Given her worsening symptoms and chest x-ray findings of potential atypical infection, will cover for pneumonia with a prescription for doxycycline.  Patient denies any potential pregnancy and does not want to be tested at this time.  We will additionally provide her with albuterol inhaler.  Patient is otherwise stable and safe for discharge.  Low suspicion for any serious or life-threatening pathology.  Recommend that she follow-up with her primary care provider within a few days to ensure improvement in symptoms.  We will plan to discharge  Considered admission for this patient, but given her stable presentation, unremarkable lab work-up, she is unlikely to benefit from admission.  Provided the patient with anticipatory guidance, return precautions, and educational material. Encouraged the patient to return to the emergency department at any time if they begin to experience any new or worsening symptoms. Patient expressed understanding and agreed with the plan.       FINAL CLINICAL IMPRESSION(S) / ED DIAGNOSES   Final diagnoses:  COVID-19     Rx / DC Orders   ED Discharge Orders          Ordered    doxycycline (ADOXA) 100 MG tablet  2 times daily        08/22/21 1242    albuterol (VENTOLIN HFA) 108 (90 Base) MCG/ACT inhaler  Every 6 hours PRN        08/22/21 1242             Note:  This document was prepared using Dragon voice recognition software and may include unintentional dictation errors.   Varney Daily, PA 08/22/21 1301    Varney Daily, [place] 08/22/21 1307    Chesley Noon, MD 08/22/21 [phone]

## 2021-09-28 ENCOUNTER — Encounter: Payer: Self-pay | Admitting: Pulmonary Disease

## 2021-09-28 ENCOUNTER — Ambulatory Visit: Payer: No Typology Code available for payment source | Admitting: Pulmonary Disease

## 2021-09-28 VITALS — BP 128/66 | HR 120 | Ht 60.0 in | Wt 123.0 lb

## 2021-09-28 DIAGNOSIS — R0602 Shortness of breath: Secondary | ICD-10-CM | POA: Diagnosis not present

## 2021-09-28 DIAGNOSIS — U099 Post covid-19 condition, unspecified: Secondary | ICD-10-CM | POA: Diagnosis not present

## 2021-09-28 NOTE — Patient Instructions (Addendum)
We will schedule high-resolution CT and PFTs for better evaluation of the lungs Follow-up in clinic in 1 to 2 months for review and evaluation

## 2021-09-28 NOTE — Progress Notes (Signed)
Olivia Daniels    [id number]    [birth year]  Primary Care Physician:Rubio, Shanda Bumps, NP  Referring Physician: Sandrea Hughs, NP [address] [place],  [place] [address]  Chief complaint: Consult for post COVID-19  HPI: 41 y.o. with no significant past medical history Developed COVID-19 in Aug 16, 2021.  She did not require hospitalization and was treated with Paxlovid as an outpatient.  Has persistent symptoms of dyspnea, fatigue since then.  She was evaluated in the ED on 5/22 with chest x-ray showing mild interstitial opacities.  Chest tube treated with doxycycline, albuterol inhaler.  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  No mold, hot tub, Jacuzzi at home.  She had down cushions in her living room 2 years ago but got rid of them Smoking history: Never smoker.  She was exposed to wood smoke through her teen years in [place] Travel history: Grew up in [place].  No significant recent travel Relevant family history: No family history of lung disease  Outpatient Encounter Medications as of 09/28/2021  Medication Sig   albuterol (VENTOLIN HFA) 108 (90 Base) MCG/ACT inhaler Inhale 2 puffs into the lungs every 6 (six) hours as needed for wheezing or shortness of breath.   ferrous sulfate (FER-IN-SOL) 75 (15 Fe) MG/ML SOLN Take by mouth.   [DISCONTINUED] guaiFENesin-codeine 100-10 MG/5ML syrup Take 5 mLs by mouth every 6 (six) hours as needed for cough.   [DISCONTINUED] ondansetron (ZOFRAN ODT) 4 MG disintegrating tablet Take 1 tablet (4 mg total) by mouth every 8 (eight) hours as needed for nausea or vomiting.   [DISCONTINUED] Prenatal Vit-Fe Fumarate-FA  (PRENATAL MULTIVITAMIN) TABS tablet Take 1 tablet by mouth daily at 12 noon.   acetaminophen (TYLENOL) 325 MG tablet Take 2 tablets (650 mg total) by mouth every 4 (four) hours as needed (for pain scale < 4  OR  temperature  >/=  100.5 F). (Patient not taking: Reported on 09/28/2021)   ibuprofen (ADVIL,MOTRIN) 600 MG tablet Take 1 tablet (600 mg total) by mouth every 6 (six) hours as needed for moderate pain. (Patient not taking: Reported on 09/28/2021)   medroxyPROGESTERone (DEPO-PROVERA) 150 MG/ML injection Inject 1 mL (150 mg total) into the muscle once for 1 dose.   No facility-administered encounter medications on file as of 09/28/2021.    Allergies as of 09/28/2021   (No Known Allergies)    No past medical history on file.  No past surgical history on file.  No family history on file.  Social History   Socioeconomic History   Marital status: Significant Other    Spouse name: Not on file   Number of children: Not on file   Years of education: Not on file   Highest education level: Not on file  Occupational History   Not on file  Tobacco Use   Smoking status: Never   Smokeless tobacco: Never  Vaping Use   Vaping Use: Never used  Substance and Sexual Activity   Alcohol use: Not Currently   Drug use: Not Currently   Sexual activity: Yes  Other Topics Concern   Not on file  Social History Narrative   Not on  file   Social Determinants of Health   Financial Resource Strain: Not on file  Food Insecurity: Not on file  Transportation Needs: Not on file  Physical Activity: Not on file  Stress: Not on file  Social Connections: Not on file  Intimate Partner Violence: Not on file    Review of systems: Review of Systems  Constitutional: Negative for fever and chills.  HENT: Negative.   Eyes: Negative for blurred vision.  Respiratory: as per HPI  Cardiovascular: Negative for chest pain and palpitations.  Gastrointestinal: Negative for vomiting, diarrhea, blood per  rectum. Genitourinary: Negative for dysuria, urgency, frequency and hematuria.  Musculoskeletal: Negative for myalgias, back pain and joint pain.  Skin: Negative for itching and rash.  Neurological: Negative for dizziness, tremors, focal weakness, seizures and loss of consciousness.  Endo/Heme/Allergies: Negative for environmental allergies.  Psychiatric/Behavioral: Negative for depression, suicidal ideas and hallucinations.  All other systems reviewed and are negative.  Physical Exam: Blood pressure 128/66, pulse (!) 120, height 5' (1.524 m), weight 123 lb (55.8 kg), SpO2 100 %, unknown if currently breastfeeding. Gen:      No acute distress HEENT:  EOMI, sclera anicteric Neck:     No masses; no thyromegaly Lungs:    Clear to auscultation bilaterally; normal respiratory effort CV:         Regular rate and rhythm; no murmurs Abd:      + bowel sounds; soft, non-tender; no palpable masses, no distension Ext:    No edema; adequate peripheral perfusion Skin:      Warm and dry; no rash Neuro: alert and oriented x 3 Psych: normal mood and affect  Data Reviewed: Imaging: Chest x-ray 11/05/2018-multifocal bilateral airspace disease Chest x-ray 08/22/2021-failed bilateral opacities I have reviewed the images personally.  PFTs:  Labs:  Assessment:  Post COVID-19 Continues to have persistent symptoms of dyspnea and fatigue, cough She would like to get her lungs checked out.  Schedule high-res CT and PFTs for better evaluation.  Plan/Recommendations: High res CT, PFTs.  Chilton Greathouse MD [facility] 09/28/2021, 2:34 PM  CC: Sandrea Hughs, NP

## 2021-10-05 ENCOUNTER — Ambulatory Visit
Admission: RE | Admit: 2021-10-05 | Discharge: 2021-10-05 | Disposition: A | Discharge status: Home / Self Care | Payer: No Typology Code available for payment source | Source: Ambulatory Visit | Attending: Pulmonary Disease | Admitting: Pulmonary Disease

## 2021-10-05 DIAGNOSIS — R0602 Shortness of breath: Secondary | ICD-10-CM | POA: Diagnosis present

## 2021-10-06 ENCOUNTER — Telehealth: Payer: Self-pay | Admitting: Pulmonary Disease

## 2021-10-06 NOTE — Telephone Encounter (Signed)
Patient is calling to get the results of her CT which was done yesterday.  Daughter stated pt. Is not feeling well and would like to get those results asap.  Please call as soon as the doctor reviews them at [phone]

## 2021-10-06 NOTE — Telephone Encounter (Signed)
ATC patient via spanish interpreter but the call would not connect. Will try again later.

## 2021-10-12 ENCOUNTER — Other Ambulatory Visit: Payer: No Typology Code available for payment source

## 2021-10-15 NOTE — Telephone Encounter (Signed)
These let them know that CT looks good with no abnormal findings.  You will need Spanish interpreter to get the results.

## 2021-10-17 NOTE — Telephone Encounter (Signed)
Called and spoke with pt via spanish interpreter letting her know the results of CT and pt verbalized understanding. Pt stated that she is not feeling much better compared to last OV with Dr. Isaiah Serge and states she is still experiencing SOB and also having burning in her back. Stated that we could get her in for an appt to further evaluate and see what might need to be done to help her out. Also stated that if she started feeling better and decided that she wanted to cancel the appt that she could call the office to get it cancelled and she verbalized understanding. Appt scheduled for pt with [facility] Friday 7/21. Nothing further needed.

## 2021-10-21 ENCOUNTER — Ambulatory Visit: Payer: No Typology Code available for payment source | Admitting: Nurse Practitioner

## 2021-12-16 ENCOUNTER — Ambulatory Visit: Payer: No Typology Code available for payment source | Admitting: Pulmonary Disease

## 2021-12-16 ENCOUNTER — Encounter: Payer: Self-pay | Admitting: Pulmonary Disease

## 2021-12-16 VITALS — BP 114/78 | HR 84 | Temp 97.9°F | Ht <= 58 in | Wt 113.9 lb

## 2021-12-16 DIAGNOSIS — R0600 Dyspnea, unspecified: Secondary | ICD-10-CM | POA: Diagnosis not present

## 2021-12-16 DIAGNOSIS — J45909 Unspecified asthma, uncomplicated: Secondary | ICD-10-CM | POA: Diagnosis not present

## 2021-12-16 DIAGNOSIS — R0602 Shortness of breath: Secondary | ICD-10-CM

## 2021-12-16 DIAGNOSIS — U099 Post covid-19 condition, unspecified: Secondary | ICD-10-CM

## 2021-12-16 LAB — CBC WITH DIFFERENTIAL/PLATELET
Basophils Absolute: 0 10*3/uL (ref 0.0–0.1)
Basophils Relative: 0.4 % (ref 0.0–3.0)
Eosinophils Absolute: 0 10*3/uL (ref 0.0–0.7)
Eosinophils Relative: 0.1 % (ref 0.0–5.0)
HCT: 37.6 % (ref 36.0–46.0)
Hemoglobin: 12.8 g/dL (ref 12.0–15.0)
Lymphocytes Relative: 21 % (ref 12.0–46.0)
Lymphs Abs: 1.8 10*3/uL (ref 0.7–4.0)
MCHC: 34.1 g/dL (ref 30.0–36.0)
MCV: 93.1 fl (ref 78.0–100.0)
Monocytes Absolute: 0.3 10*3/uL (ref 0.1–1.0)
Monocytes Relative: 3.4 % (ref 3.0–12.0)
Neutro Abs: 6.5 10*3/uL (ref 1.4–7.7)
Neutrophils Relative %: 75.1 % (ref 43.0–77.0)
Platelets: 265 10*3/uL (ref 150.0–400.0)
RBC: 4.03 Mil/uL (ref 3.87–5.11)
RDW: 12.7 % (ref 11.5–15.5)
WBC: 8.7 10*3/uL (ref 4.0–10.5)

## 2021-12-16 MED ORDER — BUDESONIDE-FORMOTEROL FUMARATE 160-4.5 MCG/ACT IN AERO: 2.0000 | INHALATION_SPRAY | Freq: Two times a day (BID) | RESPIRATORY_TRACT | 6 refills | Status: AC

## 2021-12-16 NOTE — Progress Notes (Signed)
Olivia Daniels    [id number]    02/18/1981  Primary Care Physician:Rubio, Shanda Bumps, NP  Referring Physician: Sandrea Hughs, NP [address] [place],  [place] [address]  Chief complaint: Follow-up for post COVID-19  HPI: 41 y.o. with no significant past medical history Developed COVID-19 in Aug 16, 2021.  She did not require hospitalization and was treated with Paxlovid as an outpatient.  Has persistent symptoms of dyspnea, fatigue, intermittent chest tightness since then.  Symptoms preceded Covid infection but worsened after the infection  She was evaluated in the ED on 5/22 with chest x-ray showing mild interstitial opacities.  Chest tube treated with doxycycline, albuterol inhaler.  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  No mold, hot tub, Jacuzzi at home.  She had down cushions in her living room 2 years ago but got rid of them Smoking history: Never smoker.  She was exposed to wood smoke through her teen years in [place] Travel history: Grew up in [place].  No significant recent travel Relevant family history: No family history of lung disease  Interim History: Here for review of CT scan. Unable to do PFTs due to back pain.   Outpatient Encounter Medications as of 12/16/2021  Medication Sig   albuterol (VENTOLIN HFA) 108 (90 Base) MCG/ACT inhaler Inhale 2 puffs into the lungs every 6 (six) hours as needed for wheezing or shortness of breath.   ferrous sulfate (FER-IN-SOL) 75 (15 Fe) MG/ML SOLN Take by mouth.   acetaminophen (TYLENOL) 325 MG tablet Take 2 tablets (650 mg total) by mouth every 4 (four) hours as needed (for pain scale < 4  OR  temperature   >/=  100.5 F). (Patient not taking: Reported on 09/28/2021)   ibuprofen (ADVIL,MOTRIN) 600 MG tablet Take 1 tablet (600 mg total) by mouth every 6 (six) hours as needed for moderate pain. (Patient not taking: Reported on 09/28/2021)   medroxyPROGESTERone (DEPO-PROVERA) 150 MG/ML injection Inject 1 mL (150 mg total) into the muscle once for 1 dose.   No facility-administered encounter medications on file as of 12/16/2021.    Physical Exam: Blood pressure 114/78, pulse 84, temperature 97.9 F (36.6 C), temperature source Oral, height 4\' 8"  (1.422 m), weight 113 lb 14.4 oz (51.7 kg), SpO2 100 %, unknown if currently breastfeeding. Gen:      No acute distress HEENT:  EOMI, sclera anicteric Neck:     No masses; no thyromegaly Lungs:    Clear to auscultation bilaterally; normal respiratory effort CV:         Regular rate and rhythm; no murmurs Abd:      + bowel sounds; soft, non-tender; no palpable masses, no distension Ext:    No edema; adequate peripheral perfusion Skin:      Warm and dry; no rash Neuro: alert and oriented x 3 Psych: normal mood and affect  Data Reviewed: Imaging: Chest x-ray 11/05/2018-multifocal bilateral airspace disease Chest x-ray 08/22/2021-failed bilateral opacities High-resolution CT 10/05/2021-no evidence of interstitial lung disease.  No acute airspace abnormality. I have reviewed the images personally.  PFTs: 12/16/2021-unable to complete due to back pain  Labs:  Assessment:  Post COVID-19 Continues to  have persistent symptoms of dyspnea and fatigue, cough, intermittent chest tightness High-res CT reviewed with no interstitial lung disease which is good news She may have reactive airway disease but is worsened by COVID-19.  Unable to complete PFTs Check CBC differential, IgE Trial of Symbicort inhaler   Plan/Recommendations: Check labs Symbicort  Chilton Greathouse MD [facility] 12/16/2021, 11:10 AM  CC: Sandrea Hughs, NP

## 2021-12-16 NOTE — Addendum Note (Signed)
Addended by: Arlyss Repress on: 12/16/2021 11:30 AM   Modules accepted: Orders

## 2021-12-16 NOTE — Patient Instructions (Signed)
I have reviewed his CT scan which shows no abnormalities which is good news We will check CBC with differential, IgE today We will we will start you on Symbicort 160/4.5.  Take 2 puffs twice daily Follow-up in 6 months.

## 2021-12-19 LAB — IGE: IgE (Immunoglobulin E), Serum: 11 kU/L (ref <=114)

## 2021-12-26 ENCOUNTER — Encounter: Payer: Self-pay | Admitting: *Deleted

## 2023-02-09 DIAGNOSIS — Z3200 Encounter for pregnancy test, result unknown: Secondary | ICD-10-CM | POA: Diagnosis not present

## 2023-02-09 DIAGNOSIS — Z1389 Encounter for screening for other disorder: Secondary | ICD-10-CM | POA: Diagnosis not present

## 2023-02-09 DIAGNOSIS — R5383 Other fatigue: Secondary | ICD-10-CM | POA: Diagnosis not present

## 2023-02-09 DIAGNOSIS — R110 Nausea: Secondary | ICD-10-CM | POA: Diagnosis not present

## 2023-02-09 DIAGNOSIS — N938 Other specified abnormal uterine and vaginal bleeding: Secondary | ICD-10-CM | POA: Diagnosis not present

## 2023-03-09 DIAGNOSIS — N926 Irregular menstruation, unspecified: Secondary | ICD-10-CM | POA: Diagnosis not present

## 2023-03-09 DIAGNOSIS — N938 Other specified abnormal uterine and vaginal bleeding: Secondary | ICD-10-CM | POA: Diagnosis not present

## 2023-03-14 DIAGNOSIS — R140 Abdominal distension (gaseous): Secondary | ICD-10-CM | POA: Diagnosis not present

## 2023-03-14 DIAGNOSIS — Z1331 Encounter for screening for depression: Secondary | ICD-10-CM | POA: Diagnosis not present

## 2023-03-14 DIAGNOSIS — N9489 Other specified conditions associated with female genital organs and menstrual cycle: Secondary | ICD-10-CM | POA: Diagnosis not present

## 2023-03-14 DIAGNOSIS — Z1389 Encounter for screening for other disorder: Secondary | ICD-10-CM | POA: Diagnosis not present

## 2023-04-10 ENCOUNTER — Encounter: Payer: Self-pay | Admitting: Obstetrics and Gynecology

## 2023-04-10 ENCOUNTER — Ambulatory Visit (INDEPENDENT_AMBULATORY_CARE_PROVIDER_SITE_OTHER): Payer: Self-pay | Admitting: Obstetrics and Gynecology

## 2023-04-10 VITALS — BP 123/84 | HR 91 | Ht <= 58 in | Wt 112.7 lb

## 2023-04-10 DIAGNOSIS — N939 Abnormal uterine and vaginal bleeding, unspecified: Secondary | ICD-10-CM

## 2023-04-10 DIAGNOSIS — N838 Other noninflammatory disorders of ovary, fallopian tube and broad ligament: Secondary | ICD-10-CM

## 2023-04-10 DIAGNOSIS — N840 Polyp of corpus uteri: Secondary | ICD-10-CM

## 2023-04-10 DIAGNOSIS — Z7689 Persons encountering health services in other specified circumstances: Secondary | ICD-10-CM

## 2023-04-10 NOTE — Progress Notes (Signed)
 HPI:      Ms. Olivia Daniels is a 43 y.o. [phone] who LMP was No LMP recorded (lmp unknown).  Subjective:   She presents today because she has been having irregular menses ever since she stopped Depo approximately 2 years ago.  She was seen at [facility] and an ultrasound performed showed what appears to be an endometrial polyp with a central core blood vessel. Patient is not using anything for birth control at this time but reports she is not currently sexually active.    Hx: The following portions of the patient's history were reviewed and updated as appropriate:             She  has no past medical history on file. She does not have any pertinent problems on file. She  has no past surgical history on file. Her family history is not on file. She  reports that she has never smoked. She has never used smokeless tobacco. She reports that she does not currently use alcohol. She reports that she does not currently use drugs. She has a current medication list which includes the following prescription(s): acetaminophen , albuterol , budesonide -formoterol , ferrous sulfate , and ibuprofen . She has no known allergies.       Review of Systems:  Review of Systems  Constitutional: Denied constitutional symptoms, night sweats, recent illness, fatigue, fever, insomnia and weight loss.  Eyes: Denied eye symptoms, eye pain, photophobia, vision change and visual disturbance.  Ears/Nose/Throat/Neck: Denied ear, nose, throat or neck symptoms, hearing loss, nasal discharge, sinus congestion and sore throat.  Cardiovascular: Denied cardiovascular symptoms, arrhythmia, chest pain/pressure, edema, exercise intolerance, orthopnea and palpitations.  Respiratory: Denied pulmonary symptoms, asthma, pleuritic pain, productive sputum, cough, dyspnea and wheezing.  Gastrointestinal: Denied, gastro-esophageal reflux, melena, nausea and vomiting.  Genitourinary: See HPI for additional information.  Musculoskeletal:  Denied musculoskeletal symptoms, stiffness, swelling, muscle weakness and myalgia.  Dermatologic: Denied dermatology symptoms, rash and scar.  Neurologic: Denied neurology symptoms, dizziness, headache, neck pain and syncope.  Psychiatric: Denied psychiatric symptoms, anxiety and depression.  Endocrine: Denied endocrine symptoms including hot flashes and night sweats.   Meds:   Current Outpatient Medications on File Prior to Visit  Medication Sig Dispense Refill   acetaminophen  (TYLENOL ) 325 MG tablet Take 2 tablets (650 mg total) by mouth every 4 (four) hours as needed (for pain scale < 4  OR  temperature  >/=  100.5 F). (Patient not taking: Reported on 04/10/2023)     albuterol  (VENTOLIN  HFA) 108 (90 Base) MCG/ACT inhaler Inhale 2 puffs into the lungs every 6 (six) hours as needed for wheezing or shortness of breath. (Patient not taking: Reported on 04/10/2023) 8 g 2   budesonide -formoterol  (SYMBICORT ) 160-4.5 MCG/ACT inhaler Inhale 2 puffs into the lungs 2 (two) times daily. (Patient not taking: Reported on 04/10/2023) 1 each 6   ferrous sulfate  (FER-IN-SOL) 75 (15 Fe) MG/ML SOLN Take by mouth. (Patient not taking: Reported on 04/10/2023)     ibuprofen  (ADVIL ,MOTRIN ) 600 MG tablet Take 1 tablet (600 mg total) by mouth every 6 (six) hours as needed for moderate pain. (Patient not taking: Reported on 04/10/2023) 30 tablet 0   No current facility-administered medications on file prior to visit.      Objective:     Vitals:   04/10/23 0805  BP: 123/84  Pulse: 91   Filed Weights   04/10/23 0805  Weight: 112 lb 11.2 oz (51.1 kg)              Ultrasound  results reviewed with the patient.          Assessment:    [id number] Patient Active Problem List   Diagnosis Date Noted   Indication for care in labor and delivery, antepartum 09/26/2017     1. Establishing care with new doctor, encounter for   2. Abnormal uterine bleeding due to endometrial polyp        Plan:            1.  We  have discussed endometrial polyps in detail.  The usual benign nature was discussed.  I recommended some type of endometrial sampling and removal of the polyp.  Patient has chosen hysteroscopy D&C.  I have also discussed the possibility of placing an IUD while in the OR to prevent future polyps and provide her with future birth control.  She will consider this.  2.  She will return for preop appointment. _Hysteroscopy D&C  Orders No orders of the defined types were placed in this encounter.   No orders of the defined types were placed in this encounter.     F/U  Return in about 2 weeks (around 04/24/2023). I spent 32 minutes involved in the care of this patient preparing to see the patient by obtaining and reviewing her medical history (including labs, imaging tests and prior procedures), documenting clinical information in the electronic health record (EHR), counseling and coordinating care plans, writing and sending prescriptions, ordering tests or procedures and in direct communicating with the patient and medical staff discussing pertinent items from her history and physical exam.  Alm DOROTHA Sar, M.D. 04/10/2023 8:59 AM

## 2023-04-10 NOTE — Progress Notes (Signed)
 Patient presents today due to an ovarian mass. She states last month some irregular bleeding but denies any pain. No additional concerns.

## 2023-05-22 ENCOUNTER — Encounter: Payer: 59 | Admitting: Obstetrics and Gynecology

## 2023-05-22 DIAGNOSIS — Z01818 Encounter for other preprocedural examination: Secondary | ICD-10-CM

## 2023-05-22 DIAGNOSIS — N840 Polyp of corpus uteri: Secondary | ICD-10-CM

## 2023-08-31 ENCOUNTER — Telehealth: Payer: Self-pay | Admitting: *Deleted

## 2023-09-04 ENCOUNTER — Encounter: Payer: Self-pay | Admitting: Family Medicine

## 2023-09-13 ENCOUNTER — Other Ambulatory Visit: Payer: Self-pay | Admitting: Primary Care

## 2023-09-13 DIAGNOSIS — Z1231 Encounter for screening mammogram for malignant neoplasm of breast: Secondary | ICD-10-CM

## 2023-09-27 ENCOUNTER — Ambulatory Visit
Admission: RE | Admit: 2023-09-27 | Discharge: 2023-09-27 | Disposition: A | Discharge status: Home / Self Care | Payer: Self-pay | Source: Ambulatory Visit | Attending: Primary Care | Admitting: Primary Care

## 2023-09-27 ENCOUNTER — Encounter: Payer: Self-pay | Admitting: Radiology

## 2023-09-27 DIAGNOSIS — Z1231 Encounter for screening mammogram for malignant neoplasm of breast: Secondary | ICD-10-CM | POA: Insufficient documentation

## 2023-11-05 ENCOUNTER — Ambulatory Visit: Payer: Self-pay
